# Patient Record
Sex: Male | Born: 1960 | Race: Black or African American | Marital: Single
Health system: Northeastern US, Academic
[De-identification: ages and names within clinical notes are randomized; demographics above are authoritative.]

## PROBLEM LIST (undated history)

## (undated) DIAGNOSIS — E119 Type 2 diabetes mellitus without complications: Secondary | ICD-10-CM

## (undated) DIAGNOSIS — I10 Essential (primary) hypertension: Secondary | ICD-10-CM

## (undated) HISTORY — DX: Essential (primary) hypertension: I10

## (undated) HISTORY — DX: Type 2 diabetes mellitus without complications: E11.9

---

## 2019-11-12 ENCOUNTER — Encounter: Payer: Self-pay | Admitting: Student in an Organized Health Care Education/Training Program

## 2019-11-12 ENCOUNTER — Emergency Department
Admission: EM | Admit: 2019-11-12 | Discharge: 2019-11-12 | Disposition: A | Discharge status: Home / Self Care | Payer: Medicaid (Managed Care) | Source: Ambulatory Visit | Attending: Emergency Medicine | Admitting: Emergency Medicine

## 2019-11-12 ENCOUNTER — Other Ambulatory Visit: Payer: Self-pay | Admitting: Cardiology

## 2019-11-12 ENCOUNTER — Encounter: Payer: Self-pay | Admitting: Emergency Medicine

## 2019-11-12 ENCOUNTER — Emergency Department
Admission: EM | Admit: 2019-11-12 | Discharge: 2019-11-14 | Disposition: A | Discharge status: Home / Self Care | Payer: Medicaid (Managed Care) | Source: Ambulatory Visit | Attending: Psychiatry | Admitting: Psychiatry

## 2019-11-12 DIAGNOSIS — R4789 Other speech disturbances: Secondary | ICD-10-CM

## 2019-11-12 DIAGNOSIS — F1914 Other psychoactive substance abuse with psychoactive substance-induced mood disorder: Secondary | ICD-10-CM

## 2019-11-12 DIAGNOSIS — R9431 Abnormal electrocardiogram [ECG] [EKG]: Secondary | ICD-10-CM

## 2019-11-12 DIAGNOSIS — R079 Chest pain, unspecified: Secondary | ICD-10-CM | POA: Insufficient documentation

## 2019-11-12 DIAGNOSIS — R0789 Other chest pain: Secondary | ICD-10-CM

## 2019-11-12 DIAGNOSIS — F1994 Other psychoactive substance use, unspecified with psychoactive substance-induced mood disorder: Secondary | ICD-10-CM | POA: Insufficient documentation

## 2019-11-12 DIAGNOSIS — R45851 Suicidal ideations: Secondary | ICD-10-CM

## 2019-11-12 LAB — ETHANOL: Ethanol: <10 mg/dL (ref 0–9)

## 2019-11-12 LAB — SALICYLATE LEVEL: Salicylate: <3 mg/dL — ABNORMAL LOW (ref 15.0–30.0)

## 2019-11-12 LAB — HOLD LAVENDER

## 2019-11-12 LAB — ACETAMINOPHEN LEVEL: Acetaminophen: <5 ug/mL

## 2019-11-12 NOTE — ED Provider Notes (Addendum)
History     Chief Complaint   Patient presents with    Chest Pain     Joshua Ware is a 59 y.o. male with hx of narcolepsy, HLD presenting to ED with c/o left sided chest pain that began on his way home from [facility] today on a [employer] bus. Pt does not want any medical work up, he only wants to talk to social work about getting to his daughters house.     Pt denies SOB, N/V/D, abdominal pain.           Medical/Surgical/Family History     No past medical history on file.     There is no problem list on file for this patient.           No past surgical history on file.  No family history on file.       Social History     Tobacco Use    Smoking status: Not on file   Substance Use Topics    Alcohol use: Not on file    Drug use: Not on file     Living Situation     Questions Responses    Patient lives with     Homeless     Caregiver for other family member     External Services     Employment     Domestic Violence Risk                 Review of Systems   Review of Systems   Constitutional: Negative for chills and fever.   HENT: Negative for congestion and rhinorrhea.    Eyes: Negative for discharge.   Respiratory: Negative for cough and shortness of breath.    Cardiovascular: Positive for chest pain.   Gastrointestinal: Negative for abdominal pain, diarrhea, nausea and vomiting.   Genitourinary: Negative for dysuria.   Musculoskeletal: Negative for back pain.   Skin: Negative for rash.   Neurological: Negative for syncope and headaches.       Physical Exam     Triage Vitals  Triage Start: Start, (11/12/19 2025)   First Recorded BP: (!) 149/104, Resp: 18, Temp: 36.6 C (97.9 F) Oxygen Therapy SpO2: 94 %, O2 Device: None (Room air), Heart Rate: 77, (11/12/19 0949)  .  First Pain Reported  0-10 Scale: 0, (11/12/19 4270)       Physical Exam  Vitals and nursing note reviewed.   Constitutional:       General: He is not in acute distress.     Appearance: He is well-developed. He is not diaphoretic.      Comments: Falls  asleep between questions   HENT:      Head: Normocephalic and atraumatic.      Right Ear: External ear normal.      Left Ear: External ear normal.      Nose: Nose normal.      Mouth/Throat:      Mouth: Mucous membranes are moist.   Eyes:      Extraocular Movements: Extraocular movements intact.      Conjunctiva/sclera: Conjunctivae normal.      Pupils: Pupils are equal, round, and reactive to light.   Cardiovascular:      Rate and Rhythm: Normal rate and regular rhythm.   Pulmonary:      Effort: Pulmonary effort is normal. No respiratory distress.      Breath sounds: Normal breath sounds.   Abdominal:      General: Bowel sounds are  normal.      Palpations: Abdomen is soft.      Tenderness: There is no abdominal tenderness.   Musculoskeletal:      Cervical back: Normal range of motion and neck supple.   Skin:     General: Skin is warm and dry.   Neurological:      Mental Status: He is alert and oriented to person, place, and time.   Psychiatric:         Speech: Speech is slurred.         Medical Decision Making   Patient seen by me on:  11/12/2019    Assessment:  Joshua Ware is a 59 y.o. male with hx of narcolepsy presenting to ED with left sided chest pain- pt is falling asleep during questions, in NAD overall. EKG with NSR- given age and risk factors would like to obtain cardiac workup, but pt is refusing, only wants to talk to social work. Pt appears intoxicated- denies drug and ETOH use.       Differential diagnosis:  MI, Dysrhythmia, Conduction abnormality, Pleural Effusion, Musculoskeletal,  electrolyte abnormality, COVID      Plan:  EKG  Physical exam  Offered blood work but pt declines on multiple attempts     EKG Interpretation: normal sinus rhythm, no ischemic changes    ED Course and Disposition:  11:13 AM- pt evaluated and refusing any medical workup, understands that chest pain is concerning for ACS and other cardiac pathology.           ED Course as of Nov 11 1457   Sat Nov 12, 2019   1240 Spoke with  social work, they are attempting to find family for patient       49 Pt reevaluated, he is A&Ox3, continues to refuse blood work. Pt is clinically sober and will be d/c home.           Valentino Hue, DO      Resident Attestation:    Patient seen by me on 11/12/2019.    I saw and evaluated the patient. I agree with the resident's/fellow's findings and plan of care as documented above.  Author:  Nyoka Cowden, MD       Grayland Ormond, DO  Resident  11/12/19 1459       Orville Widmann, Jen Mow, MD  11/16/19 1249

## 2019-11-12 NOTE — CPEP Notes (Signed)
CPEP Triage Note    Arrival    Patient is oriented to unit and CPEP evaluation process: Yes  Reviewed cell phone and visitor policies: yes   Reviewed contraband items/milieu safety concerns and confirmed no safety concerns present: yes    Patient is accompanied by: patient alone  Patient under MHT: Yes    History and Chief Complaint    Reason for current presentation: Pt is a 59 y/o M with unknown pphx presenting MHT'd after "consuming and unknown substance and putting himself in a dangerous situation" and making "random statements about dying in the streets and dozing off".  Pt unable to fully engage in triage as he was difficult to arouse.  Pt denies any substances.  Pt denies SI/HI/AVH.        Current Mental Health Provider(s): Unknown    Any recent exposure to infestations(lice, scabies, bedbugs, fleas) or other communicable diseases: No    Substance use: denies     Ingestion: Unknown    Self-harm: no    Medication Data Collection    Medication data collection: No    Physical Assessment    Pain assessment: Last Nursing documented pain:  0-10 Scale: 0 (11/12/19 1846)    Last Filed Vitals    11/12/19 2122   BP: (!) 142/91   Pulse: 99   Resp:    Temp: 35.9 C (96.6 F)   SpO2: 100%       Medical / Surgical History    PMH: History reviewed. No pertinent past medical history.    PSH: History reviewed. No pertinent surgical history.    Review of Systems    Any additional concerns: no    Anticipated track: 5    Jenean Lindau, RN, 11:06 PM

## 2019-11-12 NOTE — ED Notes (Signed)
Discharge papers reviewed, pt expressing understanding of recent visit and discharge instructions. Pt expressed understanding and need for follow up appointments and when to return to emergency or call PCP.

## 2019-11-12 NOTE — Discharge Instructions (Signed)
You were seen today in the [facility] for chest pain and refused blood work and a chest x-ray.    Please follow up with your primary care physician in the next 24 hours.     Return to the Emergency Department if your symptoms persist or worsen, especially if you experience severe increase in pain, shortness of breath, chest pain, headache, changes in vision, nausea, vomiting, fever, numbness, inability to walk, inability to eat or drink, or changes in behavior. Also please feel free to return to the ED for reevaluation at anytime you feel the condition warrants such a visit or you are unable contact your primary care doctor.

## 2019-11-12 NOTE — Progress Notes (Addendum)
ED SW Progress Note    SW met w/ pt in EMS Hallway to discuss arriving to [facility]. Per pt, he was in [place] visiting his grandchildren. Pt reports he took a [employer] bus from [place] to [place]. Pt reports that his daughter, Tiffany lives in [place] and that is where he resides too. Pt reports no other family in the area. Pt reports having no cell phone or identification on him.     Writer's contact with patient was via face-to-face PPE used: mask, gloves, and eye protection.   Was patient masked during visit: no.     SW attempted to search daughter via eRecord, however no success. SW to continue efforts to find family contact info and arrange safe d/c planning.    ADDENDUM 2:15PM :  SW met w/ pt again to attempt to receive more family info. Pt reporting back but not making sense during convo. SW asked pt to see [employer] bus ticket to confirm travels and demographics. SW continues efforts for safe d/c planning for pt.    ADDENDUM 3:01PM :  SW notified by RN that pt lit a cigarette and became aggressive w/ staff and is being d/c. Pt is requesting to be d/c to address on [place]. SW reported to RN that d/c address will need to be confirmed prior to pt being d/c. SW contacted 911 to request police to confirm pt's home address and confirm if any other family is at house. SW awaiting call back.    ADDENDUM 3:37PM :  SW received phone call from [place] PD reporting they did confirm pt's home address is pt's daughters address. Daughter, Tiffany phone # provided [phone]. SW contacted daughter who reported that pt is homeless and from [place] and arrives from time to time unexpectedly. Daughter reports she will come pick up pt from [facility]. RN notified.     , LMSW  ED Social Work  [id number]  PIC: [id number]

## 2019-11-12 NOTE — ED Notes (Signed)
Patient states he is here to see his daughter who resides in [place]. Does not know her phone number however is able to state her address. Says he is "just having heartburn" and is very hungry. Reports his daughters name is Daimion Adamcik and she is a Therapist, sports?. Will provide with food.

## 2019-11-12 NOTE — ED Notes (Signed)
Given [place] sandwich, jello, and cheese stick. Requesting hot meal. REfusing labs

## 2019-11-12 NOTE — ED Notes (Signed)
Also of note patient walks with a walker at baseline.

## 2019-11-12 NOTE — CPEP Notes (Signed)
CPEP Charge Nurse Note    Report received from: ED RN, via EMS, under MHT accompanied by patient alone.    Patient who is from [facility] with no documented pphx presents for passive SI secondary to substance use. Currently denies medical concerns although left AMA earlier in the day for chest pain. VSS. EKG WNL.     Chief Complaint   Patient presents with    Suicidal    Drug Overdose       Allergies as of 11/12/2019    (No Known Allergies (drug, envir, food or latex))       History reviewed. No pertinent past medical history.  Substance use: drugs and alcohol    Ingestion: Denies    Medical clearance: Yes: medically cleared: treatment recieved labs    Vital signs:  Last Filed Vitals    11/12/19 2122   BP: (!) 142/91   Pulse: 99   Resp:    Temp: 35.9 C (96.6 F)   SpO2: 100%     Last Nursing documented pain:  0-10 Scale: 0 (11/12/19 1846)    Pre-Arrival Notifications: No    Patient location: [facility]    Nelia Shi, RN, 9:39 PM

## 2019-11-12 NOTE — ED Notes (Signed)
SW called to see patient per his request. Declining other treatment at this time.

## 2019-11-12 NOTE — ED Notes (Addendum)
Patinet reached into pocket and lit cigarette in the middle of the hallway. DPS attempted to confiscate the cigarette. Patient refusing to let go of lighter and cigarette. Patient became aggressive with staff and DPS, hitting. Attempting to hit staff with cane. Cane removed. Provider notified.     Discharge order placed. Spoke with sw, no additional family or information found at this time. SW working to confirm address prior to being able to send pt back to address.

## 2019-11-12 NOTE — ED Triage Notes (Signed)
Patient states he in from [place], took a [employer] bus to [place] this morning. States he took a taxi to the wrong address and has been wondering around. Went to the fire station and reported chest pain. EMS called and brought patient here. Patient is slightly lethargic, states he just wants to go to his daughter. EKG at triage.        Triage Note   Jobe Gibbon, RN

## 2019-11-12 NOTE — ED Triage Notes (Signed)
Under MHT by [place] [employer]. Seen here earlier today for chest pain and discharged to daughters place. Pt began to drink and cause issues at daughters house, causing her to call 911. Police arrived and found pt to be lethargic making suicidal statements about " feeling like he wanted to die soon." Police pulled 5 bags of unknown substance off of pt. Pt denies pain at this time.     Triage Note   Delice Lesch, RN

## 2019-11-12 NOTE — ED Provider Notes (Signed)
History     Chief Complaint   Patient presents with    Suicidal    Drug Overdose     58yo male presents to the ED after reportedly making self harming statements to family.  Patient was seen earlier in the ED and refused further work up.  Patient questioned on his earlier presentation and patient reports he had no symptoms and to "leave him alone".  Patient reports no physical symptoms and requests to talk to the psychiatrist.  Patient denies ingestion hx, fever hx, trauma hx, headache, neck pain, chest pain, shortness of breath, palpitations, syncope, lightheadedness, confusion, abdominal pain, NVD, UTI/GIB symptoms, pelvic pain, back pain, extremity symptoms, or neurologic symptoms.        History provided by:  Patient, medical records, police and EMS personnel  History limited by:  Psychiatric disorder      Medical/Surgical/Family History     History reviewed. No pertinent past medical history.     There is no problem list on file for this patient.           History reviewed. No pertinent surgical history.  No family history on file.       Social History     Tobacco Use    Smoking status: Not on file   Substance Use Topics    Alcohol use: Not on file    Drug use: Not on file     Living Situation     Questions Responses    Patient lives with     Homeless     Caregiver for other family member     External Services     Employment     Domestic Violence Risk                 Review of Systems   Review of Systems   Constitutional: Negative for activity change, chills, fatigue and fever.   HENT: Negative for sore throat and trouble swallowing.    Eyes: Negative for visual disturbance.   Respiratory: Negative for cough, chest tightness, shortness of breath and stridor.    Cardiovascular: Negative for chest pain and leg swelling.   Gastrointestinal: Negative for abdominal pain, nausea and vomiting.   Endocrine: Negative for polyuria.   Genitourinary: Negative for flank pain and hematuria.   Musculoskeletal:  Negative for back pain and neck pain.   Skin: Negative for color change and pallor.   Neurological: Negative for weakness.   Hematological: Does not bruise/bleed easily.   Psychiatric/Behavioral: Positive for suicidal ideas.       Physical Exam     Triage Vitals  Triage Start: Start, (11/12/19 1841)   First Recorded BP: (!) 159/99, Resp: 16, Temp: 36.3 C (97.3 F), Temp src: TEMPORAL Oxygen Therapy SpO2: 96 %, O2 Device: None (Room air), Heart Rate: 79, (11/12/19 1846)  .  First Pain Reported  0-10 Scale: 0, (11/12/19 1846)       Physical Exam  Vitals and nursing note reviewed.   Constitutional:       Appearance: Normal appearance. He is well-developed. He is not diaphoretic.   HENT:      Head: Normocephalic and atraumatic. No raccoon eyes, Battle's sign, contusion or laceration. Hair is normal.      Jaw: No trismus.      Right Ear: Hearing and external ear normal.      Left Ear: Hearing and external ear normal.      Nose: Nose normal.      Mouth/Throat:  Mouth: Mucous membranes are not pale, not dry and not cyanotic. No oral lesions.      Pharynx: Uvula midline. No oropharyngeal exudate, posterior oropharyngeal erythema or uvula swelling.      Tonsils: No tonsillar abscesses.   Eyes:      General: No scleral icterus.     Pupils: Pupils are equal, round, and reactive to light.   Neck:      Vascular: No JVD.      Trachea: Phonation normal.   Cardiovascular:      Rate and Rhythm: Normal rate and regular rhythm.      Pulses: Normal pulses.      Heart sounds: Normal heart sounds. No murmur heard.     Pulmonary:      Effort: Pulmonary effort is normal. No retractions.      Breath sounds: Normal breath sounds. No stridor.   Chest:      Chest wall: No deformity, tenderness or crepitus.   Abdominal:      General: Bowel sounds are normal. There is no distension.      Palpations: Abdomen is not rigid. There is no mass.      Tenderness: There is no abdominal tenderness. There is no guarding or rebound.   Musculoskeletal:       Cervical back: Full passive range of motion without pain, normal range of motion and neck supple. No edema, erythema or rigidity. Normal range of motion.      Comments: Atraumatic without obvious signs of DVT   Skin:     General: Skin is warm and dry.      Capillary Refill: Capillary refill takes less than 2 seconds.      Coloration: Skin is not pale.      Findings: No abrasion, bruising, burn, ecchymosis, erythema, laceration, lesion, petechiae or rash.   Neurological:      General: No focal deficit present.      Mental Status: He is alert and oriented to person, place, and time.      GCS: GCS eye subscore is 4. GCS verbal subscore is 5. GCS motor subscore is 6.      Cranial Nerves: No cranial nerve deficit (2-12 intact).      Sensory: No sensory deficit (to light touch throuhgout).      Motor: No weakness.      Gait: Gait normal.   Psychiatric:         Mood and Affect: Mood normal. Affect is flat.         Speech: Speech is delayed.         Behavior: Behavior is withdrawn.         Thought Content: Thought content includes suicidal ideation.         Cognition and Memory: Cognition normal.         Medical Decision Making   Patient seen by me on:  11/12/2019    Assessment:  58yo male presents to the ED with SI; given earlier eval will obtain ECG if concerns will add additional work up however patient is currently asymptomatic; will obtain screening labs; if medically clear CPEP              Elonda Husky, DO      Resident Attestation:    Patient seen by me on 11/12/2019.    I saw and evaluated the patient. I agree with the resident's/fellow's findings and plan of care as documented above.  Author:  Theresia Lo, MD  Mickey Farber, MD  11/15/19 1001

## 2019-11-13 LAB — DRUG SCREEN CHEMICAL DEPENDENCY, URINE
Amphetamine,UR: NEGATIVE
Benzodiazepinen,UR: NEGATIVE
Cocaine/Metab,UR: NEGATIVE
Opiates,UR: NEGATIVE
THC Metabolite,UR: NEGATIVE

## 2019-11-13 LAB — FENTANYL,UR (SMH, HH, FFT): Fentanyl, UR: NEGATIVE ng/mL

## 2019-11-13 MED ORDER — OLANZAPINE 10 MG PO TBDP *I*
ORAL_TABLET | ORAL | Status: AC
Start: 2019-11-13 — End: 2019-11-13
  Administered 2019-11-13: 10 mg via ORAL
  Filled 2019-11-13: qty 1

## 2019-11-13 MED ORDER — OLANZAPINE 10 MG PO TBDP *I*
10.0000 mg | ORAL_TABLET | Freq: Once | ORAL | Status: AC
Start: 2019-11-13 — End: 2019-11-13

## 2019-11-13 NOTE — First Provider Contact (Signed)
CPEP Provider Initial Contact     CPEP initial provider evaluation performed by   CPEP Provider Initial Contact     Date/Time Event User Comments    11/13/19 0242 CPEP Provider Initial Contact Joshua Ware           Chief Complaint:     Chief Complaint   Patient presents with    Suicidal    Drug Overdose     Patient is here under MHT, with the above chief complaint.   Patient was asleep and benefit of waking patient did not outweigh risk.    Vital Signs   Reviewed:  Last Filed Vitals    11/12/19 2122   BP: (!) 142/91   Pulse: 99   Resp:    Temp: 35.9 C (96.6 F)   SpO2: 100%       Initial CPEP Plan:   collaterals and monitor and document behavior.  Patient requires further evaluation.    No other immediate concerns.      Caryn Bee How Ran [place], DO, 11/13/2019, 2:42 AM     Joshua Ware How Ran, DO  Resident  11/13/19 [phone]

## 2019-11-13 NOTE — ED Notes (Signed)
11/13/19 1100   ED PAIN ASSESSMENT   Pain Assessment / Reassessment Assessment   *Is the patient currently in pain? No/Denies   Pain Scale 0-10 (Numeric Scale for Pain Intensity)    0-10 Scale 0

## 2019-11-13 NOTE — CPEP Notes (Signed)
CPEP Clinical Evaluator Note    Patient seen by Georgeanna Lea, LMSW on 11/13/2019 at 12:58 PM.    Chief Complaint:     Chief Complaint   Patient presents with    Suicidal    Drug Overdose   .  The patient is here under MHT, alone with the above chief complaint.    History of Present Illness:     History of Present Illness:     [place] is a 59 y.o., Single (1), male who presents to CPEP mental hygiene transfer accompanied by no one with complaint/report of odd behavior and reporting SI.  Events leading to CPEP presentation include leaving the medical ED AMA, going to his daughter's home, becoming intoxicated and getting [place], with a duration/frequency of the last day.  Relevant or contributing stressors include chronic substance use and lack of supports. Patient is minimally cooperative with the evaluation process and  engages with Probation officer. Patient presents with depression and substance use. Onset of symptoms was abrupt starting 1 day ago.  Symptoms are of mild and are stable. Patient states symptoms have been exacerbated by financial burdens, chemical dependency and lack of support. Symptoms are associated with no coherent plan to harm self and drug or alcohol intoxication.    The patient shared that prior to coming to here he was thinking bad thoughts to himself. The patient would not elaborate on those bad thoughts when this writer asked. The patient shared he is originally from [facility] but came up from [facility] to visit his grandchildren at his daughters home. The patient shared that he lives in the [place] in [place], where the patient denies any Hx of Mental Health treatment. The patient denied any thoughts of wanting to harm himself or other. The patient denied any AH or VH as well, and he did not appear acutely manic or psychotic. The patient is unable to answer basic safety plan questions, and only responds with vague answers. The patient does appear to be slightly paranoid, and this may be due to the  substances he is on. Based on this writer's conversation with his daughter, the patient does not have a Hx of a mood or psychotic disorder, and believes his odd behaviors are due to his substance use.             Collateral Contacts:     Personal:    Name: [place], Relationship : Daughter, Phone: [phone]    Tiffany shared that the patient is originally from [facility], and he took a Nature conservation officer bus to [employer] 2 days unannounced. Tiffany shared the patient is chronically homeless and has been for the past 20 years. Tiffany shared that the patient has a long Hx of coming in and out of hospitals to get a place to stay, and/or to get some food. Tiffany shared that when he was in ED, she had no idea he was in [place], but the hospital called to notify her. Tiffany shared that the patient was clearly not himself when she arrived to pick him up from the hospital, he was clearly not himself. The patient has a chronic Hx of substance use, and a Mental Health Hx of Depression. Tiffany shared that once a year the patient "pops-up" at one of his adult children's home. Tiffany shared that she laid down ground rules that if he is going to stay with her, than no ETOH and no drugs, but as soon as they got home, he went right for the liquor. She  attempted to obtain the liquor from him, but he was not willing too, so she called 911. Tiffany confirmed that the patient was unwilling to follow the officer's directions, and well getting MHT'd, several bags of drugs fell out of his pockets. Tiffany shared that the patient's drug of choice is PCP, but this substance looked like K2. Tiffany shared she is not aware of any MH dx outside of Depression of Antisocial Personality Disorder, Tiffany warned that the patient can be very charismatic and charming. Tiffany shared that this is the patient's life, and at this point the entire family has lost hope for the patient to improve. Tiffany shared that patient is absolutely not welcome  back to her home, and would ask the patient be discharged to [facility] to return to [place].     Psychiatric History:   Current Treatment Providers  Psychiatrist: No  Therapist: No  Other treatment providers: None  Psychiatric History  Previous Diagnoses: None  History of suicide attempts: None  History of Non-Suicidal Self Injury: No  History of violence: None  Psychiatric hospitalizations: None  CPEP/Psych ED visits: None  History of abuse or trauma: None  Legal history: None  Is the patient currently in the [place] military or has been on active duty in the past?: No  Family psychiatric history: None    Interventions:   Purple Data Sheet, Psychosocial Assessment, Psych Eval, Psych History, Family Collateral, Safety Plan, DIRA, Additctive Behavior Screen, Personal/Professional Contact Updates, CPEP Summary and [place]:   Safety Plan Intervention  Discussed Safety Plan with Patient: Yes  Completed Safety Plan with Patient: Yes  Safety Plan Discussed with Family/Friend/Support: Yes  Strong Behavioral Health Safety Plan     Step 1: Warning signs (thoughts, images, feelings, behaviors) that a crisis may be developing:  Breathing hard  Can't sit still  Clenching fists  Loud voice  Lying  Not following directions  Rudeness  Swearing  Sweating    Step 2: Internal coping strategies - Things I can do to take my mind off my problems without contacting another person (distracting & calming activities):  being in a dark/quiet place, taking a shower, taking medications, taking space, talking to friends/family and watching television    Step 3: People and social settings that provide distraction:   Name: [place] Phone: [phone]   Name: [place] Phone: [phone]   Place: [facility] [place]    Step 4: People whom I can ask for help:  Family member: [place]; phone: [phone]  Family Member: [place]; Phone: [phone]    Step 5: Professionals or agencies I can contact during a crisis:  1.  Local Urgent Care or  Emergency Room  2.       [place]: [phone]  3.  [place] PHONE: [phone]  4.  [place] CRISIS: [phone] or 211  5.  POLICE: 254    Step 6: Making the environment safe (removing or limiting access to lethal means):  staying with family/friend, removing access to sharp objects, removing illicit substance and/or alcohol from home and access to Narcan kit    The one thing that is most important to me and worth living for is:  My family    Patient presentation, information, collateral and disposition options were reviewed and discussed with '@CPEP'$  MD@    Georgeanna Lea, LMSW

## 2019-11-13 NOTE — ED Notes (Signed)
11/13/19 1100   Interventions   Interventions q15 minute monitoring maintained;Frequent supportive interations;Psych education regarding coping skills;Nutrition/hydration;Comfort measures;Assessment of patient response to education;Medication administration

## 2019-11-13 NOTE — CPEP Notes (Signed)
Clinical Evaluator Collateral Note    Professional:                         Personal:   Name: Samin Milke, Relationship : Daughter, Phone: [phone] & Dan, Scearce, son, [phone]: Elmarie Shiley and Lona Kettle. Will not allow patient to come to their homes due to his continued substance abuse and instability.              Cecilie Kicks, MSW, 11:04 PM
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
## 2019-11-13 NOTE — CPEP Notes (Signed)
Telephoned DSS after hours, [phone], to assist with placement for patient for the night. Left a message requesting a return call.

## 2019-11-13 NOTE — CPEP Notes (Signed)
Pt has been observed resting most if not all of the day, on the couch and snoring loudly as if perhaps metabolizing substances.  When awake for a brief moment, he denied acute present needs and or safety concerns at this time.  Remains on q 15 min safety checks, will continue to monitor at this time.     Laroy Apple, RN
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
## 2019-11-14 NOTE — ED Notes (Addendum)
[facility] Emergency Housing Protocol  Contacts:   Donnajean Lopes  Supervising Examiner [phone]        [email or web address]    Alvina Chou  Sr. Examiner  [phone]        [email or web address]       Becky Miglioratti  Com. Homeless Coord. [phone]        [email or web address]      Housing Unit Group Email     [email or web address]    Clerk:      [phone]    After Hours:     [phone]     Fax:      [phone]    ? As soon as you know a patient is homeless, contact the Emergency Housing Unit [phone] to ensure that the client is not under any SANCTIONS and to get approval to send the [facility] [address] for housing.    ? Once the worker has verified the patients status fax Emergency Housing Checklist and After Visit Summary (AVS) to [phone].  ? If the patient is SANCTIONED, housing worker will inform you of this and a referral to 2-1-1 [facility] will be faxed to you. You may also call area shelters to see if they are willing to take patient hospitality, without [facility] payment  ? The housing worker will communicate with you if the patient will need to go to [facility] ([address] Avie Echevaria.) or directly to the shelter.    Defining Appropriate Emergency Housing Placement:   Disabled individuals who can transfer oneself from bed to chair/bathroom.   Individuals that are ambulating independently, and/or self-propelling in a wheel chair.   Sober individuals who are not intoxicated or under the influence of illegal drugs.   If incontinent, must be able to provide own personal care.   Individual must not be in need of higher level of care (i.e. SNF, Assisted Living etc.)   An individual who is not in need of wound care (may be acceptations on a case by case basis).      Reported he is not sanctioned. CPEP will complete checklist and DHS will call CPEP once the checklist is reviewed.

## 2019-11-14 NOTE — Discharge Instructions (Signed)
CPEP Discharge Instructions    Discharge Date: 11/14/2019    Discharge Time:     Follow-ups:  You declined connection to outpatient chemical dependency and mental health treatment at this time. If you change your mind, please see resources below. Your daughter purchased you a bus ticket to [place]. Your bus will be leaving at 7:10PM. The bus ticket will be at the main desk in the bus terminal.         Winger     How can I tell if I have an alcohol problem?    Alcohol may be harmful for you if it causes a problem in any part of your life. Following are signs that you may have a drinking problem or are alcohol dependent.      Blacking out or forgetting where you were or what you were doing    Drinking to get drunk. Or, feeling like you need to drink more to get the same feeling or "buzz"    Drinking to decrease pain or stress    Drinking more than you had expected to drink    Drinking in a pattern. For example, every day or every week at the same time    Suffering from the effects of alcohol on your daily function    Drinking starts to take over and causes problems in your daily life, such as not showing up for work or driving when you are drunk    Trying to hide how much you drink   Feeling guilty or angry when someone says something about your drinking    Seeing or hearing things that are not there (hallucinating)    Having major personality changes when you drink    Planning activities around drinking    Experiencing seizures (convulsions)    Shaking of your hands if you have not had a drink for a while    Sleeping problems or bad dreams    Sweating, nervousness, confusion, or depression    Thinking a lot about drinking    Trouble having erections     What's the harm?    Not all drinking is harmful. You may have heard that regular light to moderate drinking (from  drink a day up to 1 drink a day for women and 2 for men) can even be good for the heart. With at-risk or  heavy drinking, however, any potential benefits are outweighed by greater risks.    Injuries. Drinking too much increases your chances of being injured or even killed.  Alcohol is a factor, for example, in about 60% of fatal burn injuries, drownings, and homicides; 50% of severe trauma injuries and sexual assaults; and 40% of fatal motor vehicle crashes, suicides, and fatal falls.    Health problems. Heavy drinkers have a greater risk of liver disease, heart disease, sleep disorders, depression, stroke, bleeding from the stomach, sexually transmitted infections from unsafe sex, and several types of cancer. They may also have problems managing diabetes, high blood pressure, and other conditions.    Birth defects. Drinking during pregnancy can cause brain damage and other serious problems in the baby. Because it is not yet known whether any amount of alcohol is safe for a developing baby, women who are pregnant or may become pregnant should not drink.    Alcohol use disorders. Generally known as alcoholism and alcohol abuse, alcohol use disorders are medical conditions that doctors can diagnose when a patient's drinking causes distress or harm. In the [place]  [place], about 18 million people have an alcohol use disorder.       Wellness Hints:     Be honest and open about your alcohol use with your family, close friends, and health care professionals. Ask for and accept their help.   Avoid persons who use and/or abuse alcohol and who try to get you to drink alcohol.    Get the help of a counselor and keep regular appointments.    Eat a normal, well-balanced diet, drink six to eight glasses of water a day, and get plenty of rest.    Replace social activities associated with drinking alcohol with those that do not involve alcohol.    Consider cutting down on the amount of alcohol you drink and how often you drink it.             OPEN ACCESS CENTER  [address], [place] [address] [phone] OPEN 24 hours/ 7  days week (Spanish Speaking available)  [address], [place] [address] [phone] OPEN 7AM to 10PM/ 7 days week (closed for major holidays)  *both locations serve [place], [place], [place], [place], [place], [place], [place], [place] and [facility] is designed to take walk-in appointments. The counselor will conduct an evaluation and then connect the client to the appropriate level of care. Able to complete referrals to all levels of care.       DETOX FACILITIES:  Occupational psychologist Teacher, English as a foreign language)  [place] (6th floor) [place] [address] [phone]   [facility]ie [facility])- Sunrise   [place] [address] [phone]   [facility]  [address] [place] [address] [phone] (central intake)   [facility], Alcohol Only)  [address] [place] [address] ([place] ([place] Only)  [address] [place] [address] ([place] ([place])  [address] [place] [address]  www.[email or web address].health Sabrina Howland (intake)  [phone] ext [phone]  showland_0 .health   [facility]   (Opiates only with Suboxone)  [address] [place] [address]  www.[email or web address].edu [phone] or [place] [address] [place] [address]  www.[email or web address].com (online referral available)  [phone] (toll free)  [phone]     INPATIENT PROGRAMS:  [facility]   [address] [place] [address] ([place]   [address] [place] [address] ([place]) [phone]   [place]  [address] [place] [address] [phone] option [place]  [place] [address] [place] [address] [phone]   [facility]  [address] [place] [address] [phone]   [facility] ([place])  [address]  [place] [address] [phone]   [place]  [address] [place] [address] (direct phone# [phone])  Application online: www.horizon-health.org ([place]and[place] (Floor 3) [place] [address] [phone]    [place]  [address] [place] [address] [phone]   [place] [phone]   [facility] Horizons  [address] [place] [address] [phone]   [facility] [address]   [address] [place] [address] (located on [place]) ([facility]   [address] [place]  [place] [address] [phone]   [facility]   [place] [address] ([place]  [address] ([place], [address]) [place] [address] [phone]   Solicitor ([place])   [address] [place] [address] [phone] ext [place] and [place]  [address] [place] [address] [phone]   [place]   [address] [place] [address]  [phone]   [place]  [address] [place] [address] ([place] [facility]  [place] [address] [place] [address]  www.[email or web address] (online referral available) [phone] (toll free)  [phone]     OUTPATIENT PROGRAMS:  Action for a Better Community  [place] [address]  www.MetroFlorists.tn  *Walk in Hours M&W 8:30AM-Noon and T &TH 3PM-5PM at [address] ([phone]  Ext. [phone]   [facility] - Comprehensive Alcoholism  [address] [place] [address] ([place]  [address] [place] [address] [phone] Ext [phone]   [facility] ([place])  [place] [address]  *Walk in hours M/W/F 1pm-2:30PM at N.[employer] Financial ([place] [facility] Chemical Dependency  [address] [place] [address] ([place])  [phone]     [place]  [place] [address]  www.[email or web address].com [phone]   [facility] (Monday- Thursday)  [place] [address]  www.delphirise.Radonna Ricker [phone] on Alcohol & Substance Abuse ([place])  [address], [place], [place] [address]  [place]ulf, [place] [address]  [address], [place], [place] [address] [phone] ([address])  ([phone] Leane Para)  ([phone] Rozetta Nunnery)   [place] [address]  AvailableRingtones.at  *Walk in Hours M-F 8:30AM, 10AM, 12:30PM & 2PM ([phone]   Control and instrumentation engineer (Hispanic Outreach) - [place]  [place] [address] ([place] on Alcohol & Substance Abuse ([place])  [address] [place], [place] [address]   [address] [place], [place] [phone] [place])  [phone])   [place] (Floor 4) [place] [address] (direct# [phone]) [phone] (central intake)   [facility])  [address] [place] [address] [phone]   [facility]   [address] [place] [address] (direct phone [phone])  [address] [place] [address]  (direct phone [phone])  [place] [address] Julien Girt) (direct phone [phone])  [place] [address] [facility]) (direct phone [phone])  https://[email or web address]  *Walk in Hours M-F 8:30AM at all sites [phone]  (indicate location when calling main intake line)   [place]o  [place] [address]  www.chsbuffalo.org [phone]   [facility]   [address] [place] [address]  www.Har[place].edu ([phone]   ([place] [address])   [address]  www.westfallassociates.com [phone]     RESIDENTIAL FACILITES:  [facility] [place] ([place])  [place] [address] ([place]   [place] [address]  www.easthouse.org (  [place] [facility]   [address] [place] [address]  www.helio.health [phone]     [place]  [address] [place] [address]  https://baker.[email or web address]/ 8387639892       [facility] Supportive Living   [address] [place] [address]  www.ywca.org [phone]         VETERANS ADMINISTRATION:  [place] [address] ([place]- [phone]     [place]  [address] [place] [address] [phone]- [address] [place] [address] [phone]     [place]  [address] [place] [address] ([place]  [address] [place] [address]  *only offers intake appointment if veteran is unable to be seen at another facility.  [phone]   [facility]   [address] [place] [address] [phone]     Resources for Drug and/or Alcohol Treatment     LearningDermatology.com.au    https://ncadd-ra.org/    ADDITIONAL RESOURCES:  Al-Anon/Al-Teen (http://www.al-anon.alateen.org/)  5317192337   Alcohol Anonymous (http://[place]-aa.org/)  [phone]   Drug and Alcohol Council [phone]   Life Line [phone] or [place] on Alcohol & Substance Abuse Lutricia Feil) [phone]   Narcotics Anonymous [place] 24-hour Hotline (http://rochesterny-na.org/)  [place] of [place] (https://www.oasas.http://hubbard-west.org/)  [phone]   Pathways Methadone Maintenance Program  [address], [place], [place] [address] [phone]  M-F 6am-3pm   [facility] Recovery (http://www.refugerecovery.org/)  [place]er80_0 .com   ROCovery Fitness (http://www.rocoveryfitness.org/)   [address] [place], [place] [address] [phone]  mail_1 .Cloyd Stagers Recovery Methadone Maintenance Program  [place] [address] [place] - Adult [facility])   [facility] - [address] [facility] [phone]  o Walk-in hours for adults:  - Mondays from 1-3PM  - Tuesdays, Wednesdays, and Thursdays from [facility]   [place], [place]   [place] [phone]  o Walk-in hours for adults:  - Mondays, Wednesdays, and Thursdays from 8:30-11AM  - Tuesdays from [facility] [place] [facility]) - [address] [phone]  o Also home of [facility]   [place] - [place] [phone]   [place] [phone]   [place] [phone]  o Walk-in hours for adults:  - Tuesdays and Thursdays from 8:30-11:30AM   [place] [phone]   [place] hours for adults:  - Thursdays from 8:30-9:30AM   [facility] - [address] - [place]   [place] (60+) - [place]   [place] such as ACT (Assertive Community Treatment), MIPS (Medicine in Psychiatry), and a young adults program targeting first-break psychosis.  [facility] - [place]  o 3 levels of care include:  - Partial Hospitalization  - Intensive Outpatient Treatment  - Outpatient Therapy   [facility] of [place] [phone]      **Please be  advised about walk-in hours: this typically involves a same-day meeting with a therapist or counselor that initiates the intake process. You may be unlikely to see a provider for AT LEAST 3 visits and are not able to start medication management until that time      The clinical team recommends the following actions for creating a safe environment post discharge utilize options as identified in completed safety plan , reach out to family and/or friends for support  and remove illicit substances and/or alcohol from home     Level of outreach indicated if patient fails new intake or COPS (Comprehensive Outpatient Psychiatric Service) appointment: Routine Program Follow-up    When to call for help:    Call your psychiatric outpatient provider if experiencing any of these symptoms: increased irritability, sleep changes, appetite changes, energy changes, thoughts to harm yourself or others, anxiety, fear, auditory or visual hallucinations.  Lifeline Helpline (24 hours/7 days) [phone] Product/process development scientist)  Mobile Crisis team: [phone]    General Instructions:  Other written information given to the patient: No  Return to Work/School on: NA           The above information has been discussed with me and I have received a copy.  I understand that I am advised to follow the instructions given to me to appropriately care for my condition.    Strong Behavioral Health Safety Plan     Step 1: Warning signs (thoughts, images, feelings, behaviors) that a crisis may be developing:  Breathing hard  Can't sit still  Clenching fists  Loud voice  Lying  Not following directions  Rudeness  Swearing  Sweating    Step 2: Internal coping strategies - Things I can do to take my mind off my problems without contacting another person (distracting & calming activities):  being in a dark/quiet place, taking a shower, taking medications, taking space, talking to friends/family and watching television    Step 3: People and social settings that  provide distraction:   Name: Jonelle Sidle  Phone: [phone]   Name: Joevanni Roddey         Phone: [phone]   Place: [facility] [place]    Step 4: People whom I can ask for help:  Family member: Jonelle Sidle; phone: [phone]  Family Member: Kaynan Klonowski; Phone: [phone]    Step 5: Professionals or agencies I can contact during a crisis:  1.             Local Urgent Care or Emergency Room  2.       [place]: [phone]  3.             [place] PHONE: [phone]  4.             [place] CRISIS: [phone] or [phone]  5.             POLICE: 366    Step 6: Making the environment safe (removing or limiting access to lethal means):  staying with family/friend, removing access to sharp objects, removing illicit substance and/or alcohol from home and access to Narcan kit    The one thing that is most important to me and worth living for  is:  My family

## 2019-11-14 NOTE — ED Notes (Addendum)
Social Worker Note:     Clinical research associate spoke with Chrissie Noa regarding DHS status. Accepted DHS emergency housing placement. Robert asked if he can be placed in [place], [place]. Writer explained CPEP has access to resources within [employer]. Lindel declined connection to outpatient mental health and chemical dependency treatment in [place], [place].     Writer called DHS to follow-up on [facility] emergency housing. Phone worker asked to speak with Chrissie Noa directly for phone screen. Worker reported Zyan receives SSI and they need to speak with him to determine what he spent his funds on. Tanish spoke with DHS intake worker.    -Following conversation, DHS intake worker reported Deepak reported having his SSI funds in the back. Stated he is not eligible at this time until he used all his funds and DHS instructed him to keep receipt. Transport planner spoke with Chrissie Noa. Accepted to be cabbed to a local motel and Arts administrator to pick. Writer will cab him to local motel [facility], [address] [place], [place], [place] [address]).     Charm Barges, Relationship (Daughter, Phone: [phone]): Writer provided case update regarding discharge. Daughter expressed concern for Agamjot not having access to his reported funds. Writer searched belongings and no debit card was in funds. Daughter reported Larnce has been residing in [place], chronic homelessness and poor adherence. Daughter reported he can not return to her home. Daughter reported she is willing to purchase a bus ticket to [facility] for [place] if he agrees. Writer asked daughter to speak with him directly. Informed daughter he will be discharged.  Daughter purchased him a cab to [place] for a 7:10PM transport to [place]. Reported he has numerous family members he can use for support within [place].     Dasean wants to return to [place], [place]. Daughter purchased him a cab to [place] for a 7:10PM transport to [place]. Ticket will be waiting for him at bus station.

## 2019-11-14 NOTE — ED Notes (Addendum)
[facility]LIST    (Original template from [facility] formatted for electronic medical record)  Please complete and fax (route) with discharge instructions (AVS) to   [facility] at [phone]  Call Emergency Housing Unit for clearance prior to making any discharges to [facility].  When calling for housing assistance please provide the following information:    Date November 14, 2019 Housing Worker Taking Call:    Patient name: Joshua Ware Social Security Number: only provide if needed via phone call     Date of Birth: 1961/03/29 Place of residence prior to entering ED: no permanent housing, previously stayed with family member    Caller:    Phone: [phone]    Pager:     Fax:      Reason for homelessness: Client is not from the area      Reason for ED visit/hospitalization: Lack of supports within area   Current Functional Status: YES NO   1.   Has the patient historically demonstrated hostile/abusive         behavior (verbal and/or physical)?   []    [x]    2.   Is patient rational and lucid, demonstrating coherent thought and         conversation?  [x]  []    3.   Is patient oriented to place, person, time, and situation? [x]  []    4.   Is patient mentally and emotionally competent and stable? [x]  []    5.   Is patient able to handle his/her own business affairs? [x]  []    6.   Is patient dependent on the use of assisted device? []  [x]    7.   If not, does patient have a guardian, rep payee or case         management? []  [x]    8.   Is patient incontinent? []  [x]    9.   Does the patient have difficulty dressing, grooming, bathing or         feeding him/her self?   []    [x]    10. Does patient have difficulty walking or doing stairs         independently?  []  [x]    11.  Does the patient utilize any assisted device? []  [x]    12.  Does the patient require wound care? []  [x]    13.  Will the patient have home care nursing?  Please have all services to meet daily needs pre-arranged as necessary and documented on instruction  sheet.   []    [x]    14. Is the patient on any physical/mental health medication?  Please indicate medication and condition for its use on the   discharge instruction sheet. Please provide the patient with   enough medication until the next business day. []  [x]    15. Does patient have any follow-up appointments scheduled?  Please indicate place date and time on discharge instruction   Sheet.- PENDING []   []    16. Is patient involved with case management/formal support         services?   Please indicate name/number on discharge instruction sheet. []   [x]      Please provide the client with a meal to go.

## 2019-11-16 LAB — EKG 12-LEAD
P: 50 deg
P: 63 deg
PR: 151 ms
PR: 168 ms
QRS: 38 deg
QRS: 50 deg
QRSD: 93 ms
QRSD: 87 ms
QT: 410 ms
QT: 382 ms
QTc: 458 ms
QTc: 465 ms
Rate: 75 {beats}/min
Rate: 89 {beats}/min
T: 38 deg
T: 37 deg

## 2020-03-05 ENCOUNTER — Other Ambulatory Visit: Payer: Self-pay

## 2020-03-05 ENCOUNTER — Emergency Department (HOSPITAL_COMMUNITY): Payer: Medicaid Other

## 2020-03-05 ENCOUNTER — Emergency Department (HOSPITAL_COMMUNITY)
Admission: EM | Admit: 2020-03-05 | Discharge: 2020-03-05 | Disposition: A | Discharge status: Home / Self Care | Payer: Medicaid Other | Attending: Emergency Medicine | Admitting: Emergency Medicine

## 2020-03-05 ENCOUNTER — Encounter (HOSPITAL_COMMUNITY): Payer: Self-pay | Admitting: Emergency Medicine

## 2020-03-05 DIAGNOSIS — E119 Type 2 diabetes mellitus without complications: Secondary | ICD-10-CM | POA: Diagnosis not present

## 2020-03-05 DIAGNOSIS — I10 Essential (primary) hypertension: Secondary | ICD-10-CM | POA: Insufficient documentation

## 2020-03-05 DIAGNOSIS — R079 Chest pain, unspecified: Secondary | ICD-10-CM | POA: Diagnosis present

## 2020-03-05 LAB — BASIC METABOLIC PANEL
Anion gap: 9 (ref 5–15)
BUN: 12 mg/dL (ref 6–20)
CO2: 23 mmol/L (ref 22–32)
Calcium: 8.9 mg/dL (ref 8.9–10.3)
Chloride: 103 mmol/L (ref 98–111)
Creatinine, Ser: 1.14 mg/dL (ref 0.61–1.24)
GFR calc Af Amer: >60 mL/min (ref >60)
GFR calc non Af Amer: >60 mL/min (ref >60)
Glucose, Bld: 107 mg/dL — ABNORMAL HIGH (ref 70–99)
Potassium: 3.7 mmol/L (ref 3.5–5.1)
Sodium: 135 mmol/L (ref 135–145)

## 2020-03-05 LAB — CBC
HCT: 43.9 % (ref 39.0–52.0)
Hemoglobin: 13.5 g/dL (ref 13.0–17.0)
MCH: 24.1 pg — ABNORMAL LOW (ref 26.0–34.0)
MCHC: 30.8 g/dL (ref 30.0–36.0)
MCV: 78.3 fL — ABNORMAL LOW (ref 80.0–100.0)
Platelets: 334 10*3/uL (ref 150–400)
RBC: 5.61 MIL/uL (ref 4.22–5.81)
RDW: 19.3 % — ABNORMAL HIGH (ref 11.5–15.5)
WBC: 9.4 10*3/uL (ref 4.0–10.5)
nRBC: 0 % (ref 0.0–0.2)

## 2020-03-05 LAB — TROPONIN I (HIGH SENSITIVITY)
Troponin I (High Sensitivity): 6 ng/L (ref <18)
Troponin I (High Sensitivity): 7 ng/L (ref <18)

## 2020-03-05 NOTE — ED Triage Notes (Signed)
Pt reports having chest pain at left chest area. Pt denies any radiation.

## 2020-03-05 NOTE — Discharge Instructions (Addendum)
Please follow-up with your primary care doctor regarding the symptoms that you are experiencing today.  If you have any recurrent chest pain, passing out or other new concerning symptom, return to ER for reassessment.

## 2020-03-05 NOTE — ED Notes (Signed)
Pt reports to have narcolepsy. He said he was on a bus to [place], [place] and fell asleep ending up in [place]. He is requesting food.

## 2020-03-05 NOTE — ED Notes (Addendum)
Patient refused to put a mask on while in the lobby. Patient was given a mask by Security, but continued to refuse. Patient was asked again to put the mask on. Patient said Call security. Security came to the lobby. Patient then said he wanted to be taken outside. Patient taken outside with his suitcase.

## 2020-03-05 NOTE — ED Notes (Signed)
Sign block will not work in room. Pt verbally states he understands instructions.

## 2020-03-05 NOTE — ED Notes (Signed)
Patient came back into the lobby, pushing his suitcases in a wheelchair. Patient did not have his mask on over his nose. Patient was asked to put his mask on properly. Patient began banging his fists into his hand as if he was going to punch someone and called Clinical research associate a Research officer, political party. Secuirty called.

## 2020-03-05 NOTE — Progress Notes (Addendum)
..  Transition of Care [facility]) - Emergency Department Mini Assessment   Patient Details  Name: Terrence Anthony MRN: [id number] Date of Birth: February 17, 1961  Transition of Care [facility]) CM/SW Contact:    Tyarra Nolton C Tarpley-Carter, LCSWA Phone Number: 03/05/2020, 11:09 AM   Clinical Narrative: TOC CSW consulted with pt in regards to pt needing transportation to [place], [place].  Pt missed bus stop in [place], [place], due to narcolepsy.  When pt woke up he was in [place], [place].  Pt does not have family or friends in [place], [place], and no financial means to assist with getting back to [place], [place].  Seniah Lawrence Tarpley-Carter, MSW, LCSW-A Pronouns:  She, Her, Hers                  [facility] ED Transitions of CareClinical Social Worker [email or web address] [phone]   ED Mini Assessment: What brought you to the Emergency Department? : Due to medical condition (narcolepsy), pt missed bus stop.  Barriers to Discharge: Barriers Resolved  Barrier interventions: No financial means for new bus ticket.  Means of departure: Public Transportation  Interventions which prevented an admission or readmission: Transportation Screening    Patient Contact and Communications        ,          Patient states their goals for this hospitalization and ongoing recovery are:: To obtain bus ticket to [place], [place].      Admission diagnosis:  Chest Pain There are no problems to display for this patient.  PCP:  Pcp, No Pharmacy:  No Pharmacies Listed

## 2020-03-05 NOTE — Progress Notes (Signed)
@  11:00 am TOC CSW contacted [facility] shuttle to assist pt with getting to the bus stop.  CSW also [place] bus pass to get to the bus depot, so he can then catch the [employer] back to [place], [place].  Pt needed assistance with getting to the bus stop.  [facility] assisted pt with getting to bus stop safely.    Please reconsult if new social work issues arise, CSW signing off.  Coltyn Hanning Tarpley-Carter, MSW, LCSW-A Pronouns:  She, Her, Hers                  [facility] ED Transitions of CareClinical Social Worker [email or web address] [phone]

## 2020-03-05 NOTE — ED Notes (Signed)
Pt now wanting someone to check his ears as he finds out that he is being discharged.

## 2020-03-05 NOTE — ED Provider Notes (Signed)
[facility]-EMERGENCY DEPT Provider Note   CSN: [id number] Arrival date & time: 03/05/20  8366     History Chief Complaint  Patient presents with  . Chest Pain    Terrence Anthony is a 59 y.o. male. Presents to ER with concern for chest pain, possible syncopal episode. Patient reports that he was traveling to [place] on the bus, stopped in [place]. He he thinks he may have had an episode of passing out last night, but does not recall the details. States that he feels tired. Had reported an episode of chest pain to triage staff however he denies any chest pain, denies ever having any chest pain to me at time of initial and subsequent interview. No shortness of breath.  History of diabetes mellitus, hypertension. Non-smoker.  HPI     Past Medical History:  Diagnosis Date  . Diabetes mellitus without complication (HCC)   . Hypertension     There are no problems to display for this patient.   History reviewed. No pertinent surgical history.     History reviewed. No pertinent family history.  Social History   Tobacco Use  . Smoking status: Never Smoker  . Smokeless tobacco: Never Used  Substance Use Topics  . Alcohol use: Yes  . Drug use: Never    Home Medications Prior to Admission medications   Not on File    Allergies    Patient has no known allergies.  Review of Systems   Review of Systems  Constitutional: Negative for chills and fever.  HENT: Negative for ear pain and sore throat.   Eyes: Negative for pain and visual disturbance.  Respiratory: Negative for cough and shortness of breath.   Cardiovascular: Positive for chest pain. Negative for palpitations.  Gastrointestinal: Negative for abdominal pain and vomiting.  Genitourinary: Negative for dysuria and hematuria.  Musculoskeletal: Negative for arthralgias and back pain.  Skin: Negative for color change and rash.  Neurological: Positive for syncope. Negative for  seizures.  All other systems reviewed and are negative.   Physical Exam Updated Vital Signs BP (!) 169/96 (BP Location: Right Arm)   Pulse 87   Temp 97.6 F (36.4 C) (Oral)   Resp 18   Ht 5\' 8"  (1.727 m)   Wt 132.9 kg   SpO2 99%   BMI 44.55 kg/m   Physical Exam Vitals and nursing note reviewed.  Constitutional:      Appearance: He is well-developed.  HENT:     Head: Normocephalic and atraumatic.  Eyes:     Conjunctiva/sclera: Conjunctivae normal.  Cardiovascular:     Rate and Rhythm: Normal rate and regular rhythm.     Heart sounds: No murmur heard.   Pulmonary:     Effort: Pulmonary effort is normal. No respiratory distress.     Breath sounds: Normal breath sounds.  Abdominal:     Palpations: Abdomen is soft.     Tenderness: There is no abdominal tenderness.  Musculoskeletal:     Cervical back: Neck supple.     Right lower leg: No edema.     Left lower leg: No edema.  Skin:    General: Skin is warm and dry.     Capillary Refill: Capillary refill takes less than 2 seconds.  Neurological:     General: No focal deficit present.     Mental Status: He is alert and oriented to person, place, and time.     ED Results / Procedures / Treatments   Labs (all  labs ordered are listed, but only abnormal results are displayed) Labs Reviewed  BASIC METABOLIC PANEL - Abnormal; Notable for the following components:      Result Value   Glucose, Bld 107 (*)    All other components within normal limits  CBC - Abnormal; Notable for the following components:   MCV 78.3 (*)    MCH 24.1 (*)    RDW 19.3 (*)    All other components within normal limits  TROPONIN I (HIGH SENSITIVITY)  TROPONIN I (HIGH SENSITIVITY)    EKG EKG Interpretation  Date/Time:  Monday March 05 2020 05:40:41 EDT Ventricular Rate:  107 PR Interval:    QRS Duration: 97 QT Interval:  319 QTC Calculation: 426 R Axis:   69 Text Interpretation: Sinus tachycardia Left atrial enlargement Borderline T  wave abnormalities 12 Lead; Mason-Likar Confirmed by Marianna Fuss ([id number]) on 03/05/2020 7:37:29 AM   Radiology DG Chest 2 View  Result Date: 03/05/2020 CLINICAL DATA:  Chest pain EXAM: CHEST - 2 VIEW COMPARISON:  None. FINDINGS: The lungs are clear without focal pneumonia, edema, pneumothorax or pleural effusion. Cardiopericardial silhouette is at upper limits of normal for size. There is pulmonary vascular congestion without overt pulmonary edema. The visualized bony structures of the thorax show no acute abnormality. IMPRESSION: Pulmonary vascular congestion without overt pulmonary edema. No other acute cardiopulmonary findings. Electronically Signed   By: Kennith Center M.D.   On: 03/05/2020 06:45    Procedures Procedures (including critical care time)  Medications Ordered in ED Medications - No data to display  ED Course  I have reviewed the triage vital signs and the nursing notes.  Pertinent labs & imaging results that were available during my care of the patient were reviewed by me and considered in my medical decision making (see chart for details).    MDM Rules/Calculators/A&P                         59 year old male with history of diabetes, hypertension presents to ER with initial chief complaint of chest pain. When I assessed patient, he denied any ongoing symptoms and denied having chest pain. He reports that he may have had an episode of passing out, lightheadedness but did not recall further details. His vital signs are stable, EKG without acute ischemic changes, troponin x2 within normal limits, doubt ACS. No events on telemetry monitoring. CXR is clear. Given no ongoing symptoms, reassuring work-up, believe he can be discharged and managed in the outpatient setting. Recommended that he follow-up with his primary care doctor back home.    After the discussed management above, the patient was determined to be safe for discharge.  The patient was in agreement with this plan  and all questions regarding their care were answered.  ED return precautions were discussed and the patient will return to the ED with any significant worsening of condition.    Final Clinical Impression(s) / ED Diagnoses Final diagnoses:  Chest pain, unspecified type    Rx / DC Orders ED Discharge Orders    None       Milagros Loll, MD 03/05/20 1157

## 2022-06-03 IMAGING — CR DG CHEST 2V
2 series · 2 of 2 positions shown · non-contrast
Comparison: None.

CLINICAL DATA: Chest pain

EXAM:
CHEST - 2 VIEW

[w chest pa]
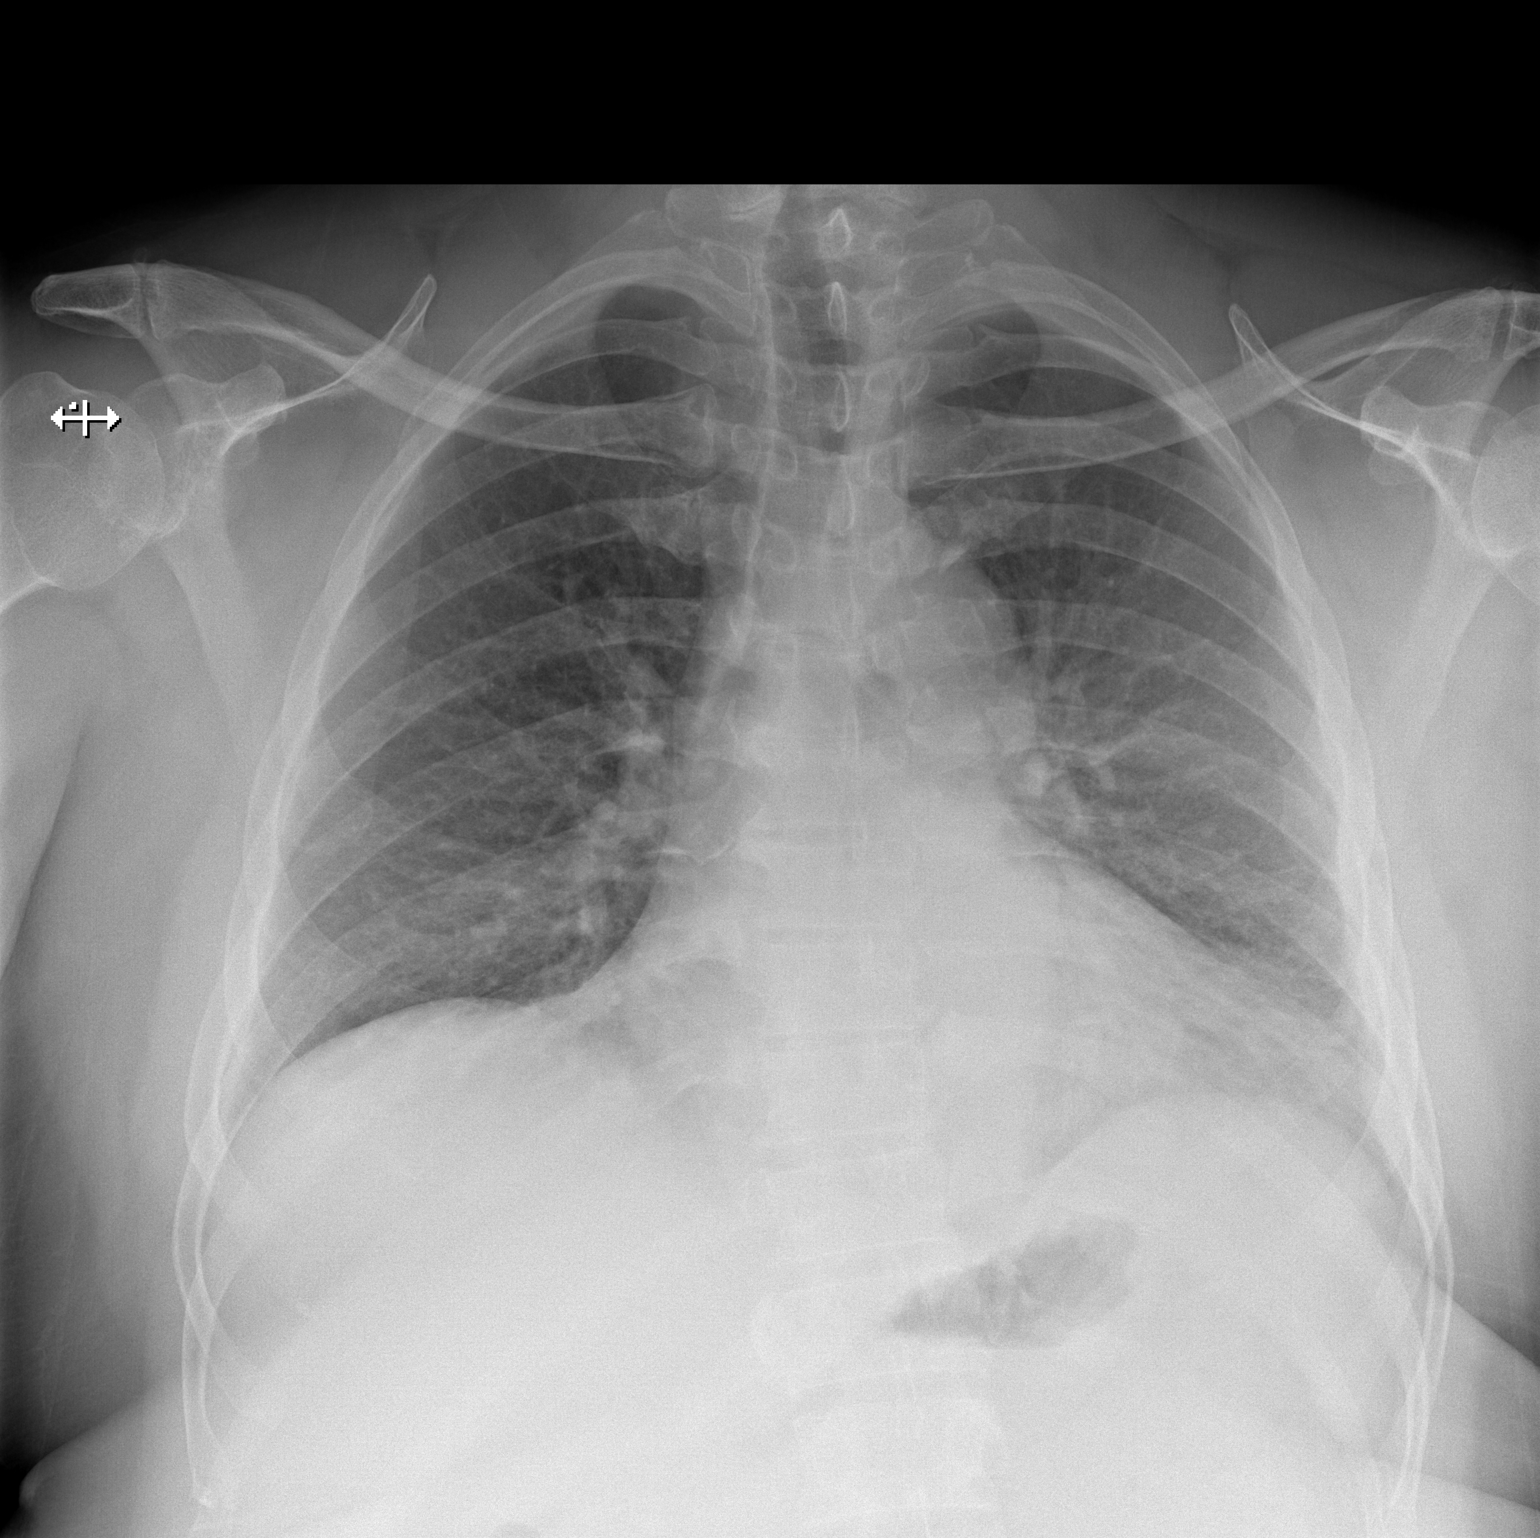

[w chest lat]
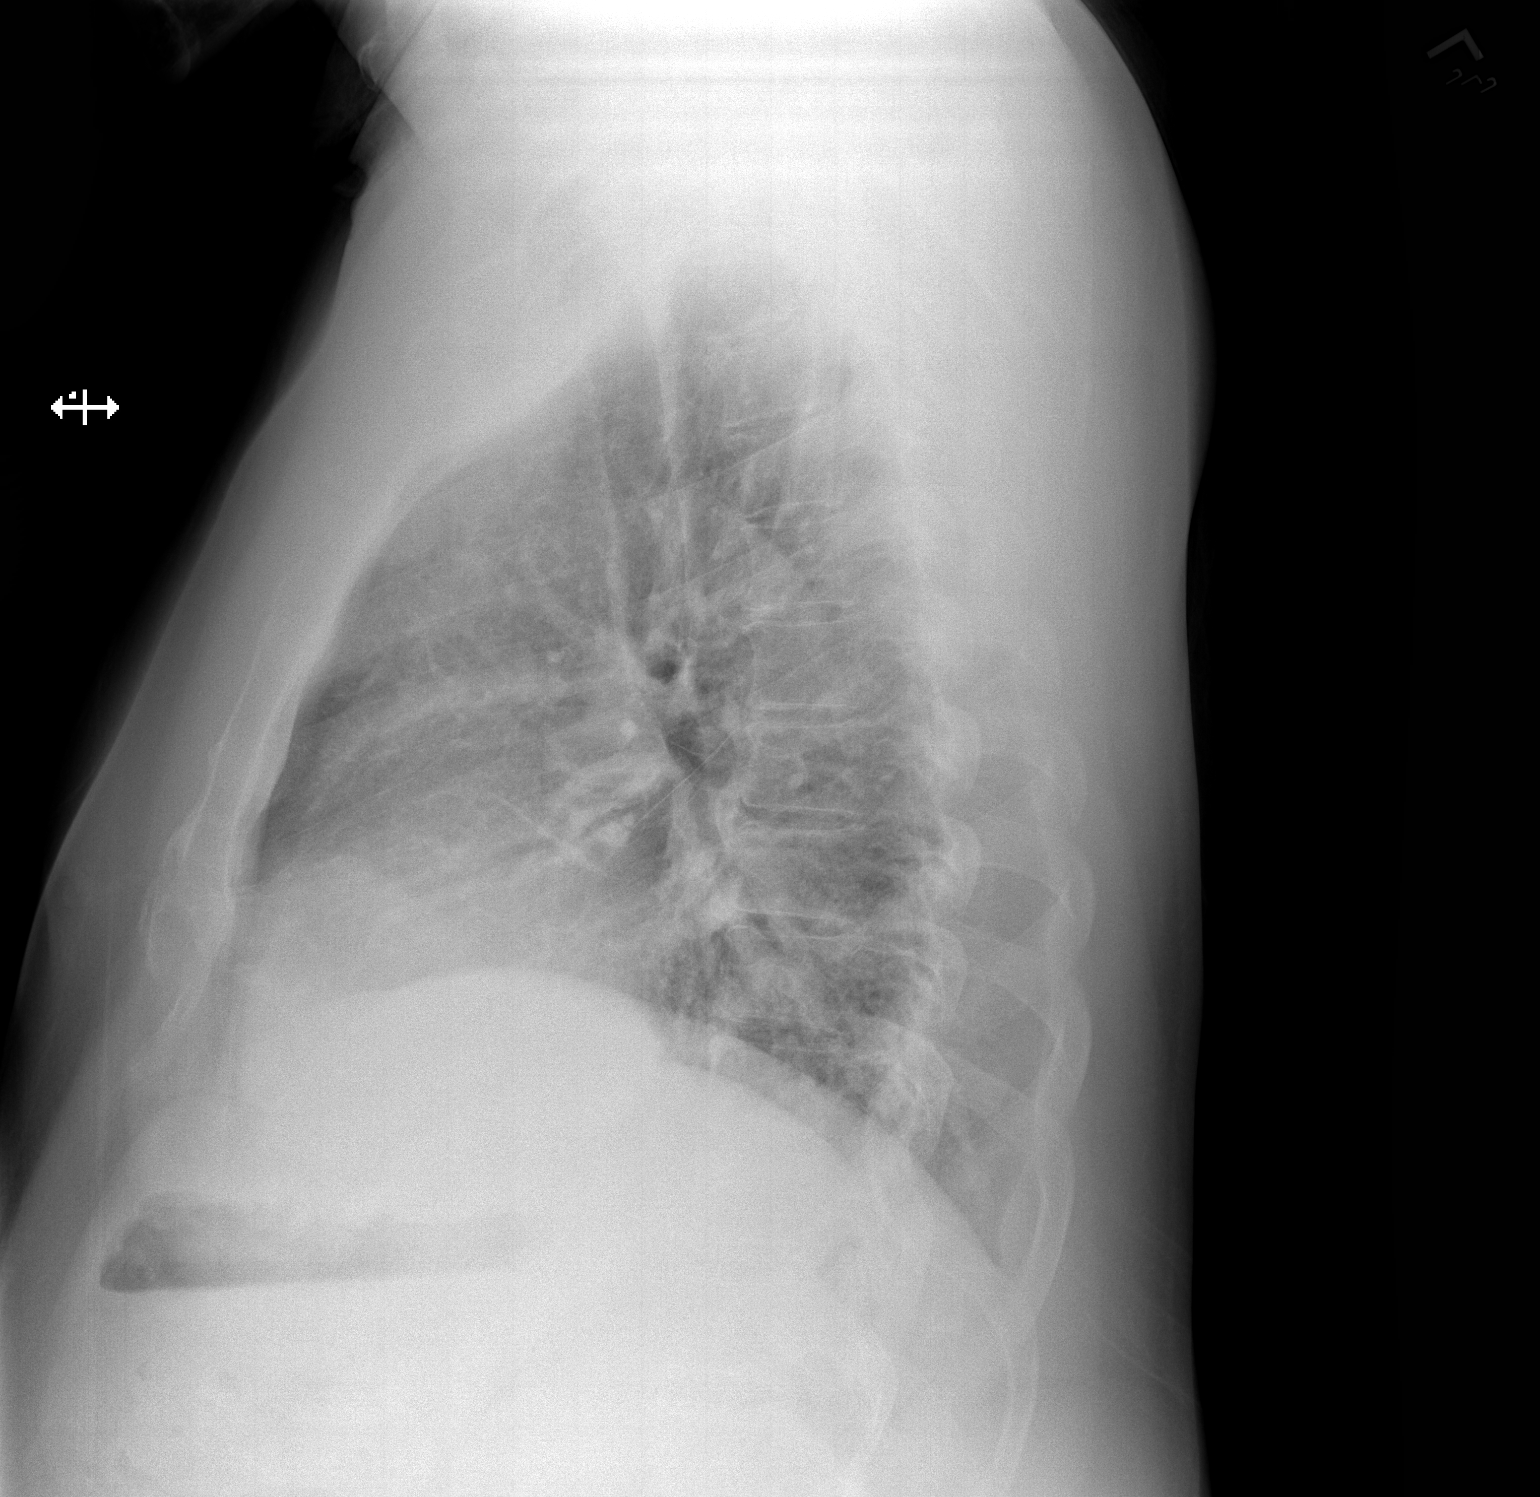

[2 of 2 positions shown; findings below may reference images not displayed]

FINDINGS: The lungs are clear without focal pneumonia, edema, pneumothorax or
pleural effusion. Cardiopericardial silhouette is at upper limits of
normal for size. There is pulmonary vascular congestion without
overt pulmonary edema. The visualized bony structures of the thorax
show no acute abnormality.
IMPRESSION: Pulmonary vascular congestion without overt pulmonary edema. No
other acute cardiopulmonary findings.

## 8387-02-01 DEATH — deceased
